# Patient Record
Sex: Female | Born: 2000 | Race: White | Hispanic: No | Marital: Single | Smoking: Never smoker
Health system: Southern US, Community
[De-identification: ages and names within clinical notes are randomized; demographics above are authoritative.]

---

## 2021-01-31 ENCOUNTER — Emergency Department
Admission: EM | Admit: 2021-01-31 | Discharge: 2021-01-31 | Disposition: A | Discharge status: Home / Self Care | Payer: PRIVATE HEALTH INSURANCE | Attending: Student in an Organized Health Care Education/Training Program | Admitting: Student in an Organized Health Care Education/Training Program

## 2021-01-31 ENCOUNTER — Emergency Department: Payer: PRIVATE HEALTH INSURANCE

## 2021-01-31 ENCOUNTER — Other Ambulatory Visit: Payer: Self-pay

## 2021-01-31 ENCOUNTER — Encounter: Payer: Self-pay | Admitting: Emergency Medicine

## 2021-01-31 DIAGNOSIS — S93491A Sprain of other ligament of right ankle, initial encounter: Secondary | ICD-10-CM

## 2021-01-31 DIAGNOSIS — S99911A Unspecified injury of right ankle, initial encounter: Secondary | ICD-10-CM | POA: Diagnosis present

## 2021-01-31 DIAGNOSIS — S93431A Sprain of tibiofibular ligament of right ankle, initial encounter: Secondary | ICD-10-CM | POA: Diagnosis not present

## 2021-01-31 DIAGNOSIS — W130XXA Fall from, out of or through balcony, initial encounter: Secondary | ICD-10-CM | POA: Diagnosis not present

## 2021-01-31 MED ORDER — HYDROCODONE-ACETAMINOPHEN 5-325 MG PO TABS: 1.0000 | ORAL_TABLET | ORAL | 0 refills | Status: DC | PRN

## 2021-01-31 NOTE — ED Provider Notes (Signed)
[facility] Emergency Department Provider Note    Event Date/Time   First MD Initiated Contact with Patient 01/31/21 1214     (approximate)  I have reviewed the triage vital signs and the nursing notes.   HISTORY  Chief Complaint Fall and Ankle Pain    HPI Sandra Delacruz is a 20 y.o. female presents to the ER for evaluation of right ankle injury that occurred after she was on a balcony last night and a friend that was pulling on her leg and she fell off the balcony rolling her right ankle.  Did not hit her head no other injury.  Is having some swelling to the right lateral aspect of her ankle.  Has had sprain and tendon injury to her left foot but never on the right.  Denies any numbness or tingling.  No other injury reported.  Took some Motrin without much relief.  Declining anything for pain right now  History reviewed. No pertinent past medical history. No family history on file. History reviewed. No pertinent surgical history. There are no problems to display for this patient.     Prior to Admission medications   Medication Sig Start Date End Date Taking? Authorizing Provider  HYDROcodone-acetaminophen (NORCO) 5-325 MG tablet Take 1 tablet by mouth every 4 (four) hours as needed for moderate pain. 01/31/21  Yes Willy Eddy, MD    Allergies Patient has no allergy information on record.    Social History    Review of Systems Patient denies headaches, rhinorrhea, blurry vision, numbness, shortness of breath, chest pain, edema, cough, abdominal pain, nausea, vomiting, diarrhea, dysuria, fevers, rashes or hallucinations unless otherwise stated above in HPI. ____________________________________________   PHYSICAL EXAM:  VITAL SIGNS: Vitals:   01/31/21 1129  BP: 112/62  Pulse: 67  Resp: 20  Temp: 98.2 F (36.8 C)  SpO2: 97%    Constitutional: Alert and oriented. Well appearing and in no acute distress. Eyes: Conjunctivae are  normal.  Head: Atraumatic. Nose: No congestion/rhinnorhea. Mouth/Throat: Mucous membranes are moist.   Neck: Painless ROM.  Cardiovascular:   Good peripheral circulation. Respiratory: Normal respiratory effort.  No retractions.  Gastrointestinal: Soft and nontender.  Musculoskeletal: Swelling and some ecchymosis of the right ATFL.  Neurovascular intact distally.  Neurologic:  Normal speech and language. No gross focal neurologic deficits are appreciated.  Skin:  Skin is warm, dry and intact. No rash noted. Psychiatric: Mood and affect are normal. Speech and behavior are normal.  ____________________________________________   LABS (all labs ordered are listed, but only abnormal results are displayed)  No results found for this or any previous visit (from the past 24 hour(s)). ____________________________________________ ____________________________________________  RADIOLOGY  I personally reviewed all radiographic images ordered to evaluate for the above acute complaints and reviewed radiology reports and findings.  These findings were personally discussed with the patient.  Please see medical record for radiology report.  ____________________________________________   PROCEDURES  Procedure(s) performed:  Procedures    Critical Care performed: no ____________________________________________   INITIAL IMPRESSION / ASSESSMENT AND PLAN / ED COURSE  Pertinent labs & imaging results that were available during my care of the patient were reviewed by me and considered in my medical decision making (see chart for details).   DDX: Fracture, contusion, dislocation, sprain  Sandra Delacruz is a 20 y.o. who presents to the ED with 20 y.o. female with right ankle injury. Patient is AFVSS in ED. Exam as above. Given current presentation have considered the above differential.  Denies any other injuries. Denies motor or sensory loss. Able to bear weight. Afebrile and VSS in Ed. Exam as  above. NV intact throughout and distal to injury. Pt able to range joint. No clinical suspicion for infectious process or septic joint. Treatments will include observation, X-rays.  X-rays w/o fracture. No other injuries reported or noted on exam. Presentation c/w sprain. Discussed supportive care and follow up with pt.     The patient was evaluated in Emergency Department today for the symptoms described in the history of present illness. He/she was evaluated in the context of the global COVID-19 pandemic, which necessitated consideration that the patient might be at risk for infection with the SARS-CoV-2 virus that causes COVID-19. Institutional protocols and algorithms that pertain to the evaluation of patients at risk for COVID-19 are in a state of rapid change based on information released by regulatory bodies including the CDC and federal and state organizations. These policies and algorithms were followed during the patient's care in the ED.   ____________________________________________   FINAL CLINICAL IMPRESSION(S) / ED DIAGNOSES  Final diagnoses:  Sprain of anterior talofibular ligament of right ankle, initial encounter      NEW MEDICATIONS STARTED DURING THIS VISIT:  New Prescriptions   HYDROCODONE-ACETAMINOPHEN (NORCO) 5-325 MG TABLET    Take 1 tablet by mouth every 4 (four) hours as needed for moderate pain.     Note:  This document was prepared using Dragon voice recognition software and may include unintentional dictation errors.     Willy Eddy, MD 01/31/21 [phone]

## 2021-01-31 NOTE — ED Triage Notes (Signed)
Pt reports fell off a porch yesterday hurting her right ankle. Pt states painful to walk on

## 2021-01-31 NOTE — ED Notes (Signed)
Follow uortho info provided crutches and splint provided all questions answered

## 2021-07-07 ENCOUNTER — Emergency Department
Admission: EM | Admit: 2021-07-07 | Discharge: 2021-07-07 | Disposition: A | Discharge status: Home / Self Care | Payer: No Typology Code available for payment source | Attending: Emergency Medicine | Admitting: Emergency Medicine

## 2021-07-07 ENCOUNTER — Encounter: Payer: Self-pay | Admitting: Emergency Medicine

## 2021-07-07 DIAGNOSIS — L292 Pruritus vulvae: Secondary | ICD-10-CM | POA: Diagnosis present

## 2021-07-07 DIAGNOSIS — N3000 Acute cystitis without hematuria: Secondary | ICD-10-CM | POA: Diagnosis not present

## 2021-07-07 LAB — WET PREP, GENITAL
Clue Cells Wet Prep HPF POC: NONE SEEN
Sperm: NONE SEEN
Trich, Wet Prep: NONE SEEN
WBC, Wet Prep HPF POC: >=10 — AB (ref <10)
Yeast Wet Prep HPF POC: NONE SEEN

## 2021-07-07 LAB — URINALYSIS, COMPLETE (UACMP) WITH MICROSCOPIC
Bilirubin Urine: NEGATIVE
Glucose, UA: NEGATIVE mg/dL
Hgb urine dipstick: NEGATIVE
Ketones, ur: NEGATIVE mg/dL
Nitrite: NEGATIVE
Protein, ur: 30 mg/dL — AB
Specific Gravity, Urine: 1.028 (ref 1.005–1.030)
WBC, UA: >50 WBC/hpf — ABNORMAL HIGH (ref 0–5)
pH: 6 (ref 5.0–8.0)

## 2021-07-07 LAB — POC URINE PREG, ED: Preg Test, Ur: NEGATIVE

## 2021-07-07 MED ORDER — CEPHALEXIN 500 MG PO CAPS: 500.0000 mg | ORAL_CAPSULE | Freq: Four times a day (QID) | ORAL | 0 refills | Status: AC

## 2021-07-07 MED ORDER — CEPHALEXIN 500 MG PO CAPS
500.0000 mg | ORAL_CAPSULE | Freq: Once | ORAL | Status: AC
  Administered 2021-07-07: 500 mg via ORAL
  Filled 2021-07-07: qty 1

## 2021-07-07 NOTE — ED Triage Notes (Signed)
Pt c/o vaginal itching, burning, white discharge and pain x2 days. Pt used OTC Monistat today in which she sts made symptoms worse.  ?

## 2021-07-07 NOTE — Discharge Instructions (Addendum)
Take Keflex four times daily for the next seven days.  

## 2021-07-07 NOTE — ED Provider Notes (Signed)
? ?[facility] ?Provider Note ? ?Patient Contact: 10:58 PM (approximate) ? ? ?History  ? ?Vaginal Itching ? ? ?HPI ? ?Sandra Delacruz is a 21 y.o. female presents to the urgent care with increased vaginal discharge and vaginal pruritus for the past 2 days.  Patient also states that she has burning constantly.  She denies vaginal rash.  She denies concerns for STDs. ? ?  ? ? ?Physical Exam  ? ?Triage Vital Signs: ?ED Triage Vitals [07/07/21 2221]  ?Enc Vitals Group  ?   BP 127/87  ?   Pulse Rate 87  ?   Resp 16  ?   Temp 98.5 ?F (36.9 ?C)  ?   Temp Source Oral  ?   SpO2 100 %  ?   Weight 103 lb (46.7 kg)  ?   Height 5\' 3"  (1.6 m)  ?   Head Circumference   ?   Peak Flow   ?   Pain Score   ?   Pain Loc   ?   Pain Edu?   ?   Excl. in GC?   ? ? ?Most recent vital signs: ?Vitals:  ? 07/07/21 2221  ?BP: 127/87  ?Pulse: 87  ?Resp: 16  ?Temp: 98.5 ?F (36.9 ?C)  ?SpO2: 100%  ? ? ? ?General: Alert and in no acute distress. ?Eyes:  PERRL. EOMI. ?Head: No acute traumatic findings ?ENT: ?     Ears:  ?     Nose: No congestion/rhinnorhea. ?     Mouth/Throat: Mucous membranes are moist. ?Neck: No stridor. No cervical spine tenderness to palpation. ?Cardiovascular:  Good peripheral perfusion ?Respiratory: Normal respiratory effort without tachypnea or retractions. Lungs CTAB. Good air entry to the bases with no decreased or absent breath sounds. ?Gastrointestinal: Bowel sounds ?4 quadrants. Soft and nontender to palpation. No guarding or rigidity. No palpable masses. No distention. No CVA tenderness. ?Musculoskeletal: Full range of motion to all extremities.  ?Neurologic:  No gross focal neurologic deficits are appreciated.  ?Skin:   No rash noted ?Other: ? ? ?ED Results / Procedures / Treatments  ? ?Labs ?(all labs ordered are listed, but only abnormal results are displayed) ?Labs Reviewed  ?WET PREP, GENITAL - Abnormal; Notable for the following components:  ?    Result Value  ? WBC, Wet Prep HPF POC >=10 (*)   ?  All other components within normal limits  ?URINALYSIS, COMPLETE (UACMP) WITH MICROSCOPIC - Abnormal; Notable for the following components:  ? Color, Urine YELLOW (*)   ? APPearance CLOUDY (*)   ? Protein, ur 30 (*)   ? Leukocytes,Ua LARGE (*)   ? WBC, UA >50 (*)   ? Bacteria, UA RARE (*)   ? All other components within normal limits  ?CHLAMYDIA/NGC RT PCR ([facility] ONLY)            ?POC URINE PREG, ED  ? ? ? ? ? ?PROCEDURES: ? ?Critical Care performed: No ? ?Procedures ? ? ?MEDICATIONS ORDERED IN ED: ?Medications  ?cephALEXin (KEFLEX) capsule 500 mg (500 mg Oral Given 07/07/21 2322)  ? ? ? ?IMPRESSION / MDM / ASSESSMENT AND PLAN / ED COURSE  ?I reviewed the triage vital signs and the nursing notes. ?             ?               ? ?Differential diagnosis includes, but is not limited to, vaginal itching, increased vaginal discharge... ? ? ? Wet  prep shows no signs of yeast or BV. Urinalysis concerning for UTI. Urine pregnancy test negative. Will treat with Keflex four times daily for the next seven days.  ? ? ?FINAL CLINICAL IMPRESSION(S) / ED DIAGNOSES  ? ?Final diagnoses:  ?Acute cystitis without hematuria  ? ? ? ?Rx / DC Orders  ? ?ED Discharge Orders   ? ?      Ordered  ?  cephALEXin (KEFLEX) 500 MG capsule  4 times daily       ? 07/07/21 2317  ? ?  ?  ? ?  ? ? ? ?Note:  This document was prepared using Dragon voice recognition software and may include unintentional dictation errors. ?  ?Orvil Feil, PA-C ?07/07/21 2330 ? ?  ?Merwyn Katos, MD ?07/14/21 [phone] ? ?

## 2021-07-08 LAB — CHLAMYDIA/NGC RT PCR (ARMC ONLY)
Chlamydia Tr: NOT DETECTED
N gonorrhoeae: NOT DETECTED

## 2022-01-20 ENCOUNTER — Ambulatory Visit (INDEPENDENT_AMBULATORY_CARE_PROVIDER_SITE_OTHER): Payer: PRIVATE HEALTH INSURANCE | Admitting: Adult Health

## 2022-01-20 ENCOUNTER — Encounter: Payer: Self-pay | Admitting: Adult Health

## 2022-01-20 VITALS — BP 112/70 | HR 78 | Temp 97.7°F | Ht 63.0 in | Wt 117.0 lb

## 2022-01-20 DIAGNOSIS — J0140 Acute pansinusitis, unspecified: Secondary | ICD-10-CM

## 2022-01-20 MED ORDER — AZITHROMYCIN 250 MG PO TABS: ORAL_TABLET | ORAL | 0 refills | Status: AC

## 2022-01-20 NOTE — Progress Notes (Signed)
[facility] [address] [place], [place] [address] Phone: [phone] Fax: [phone]   Office Visit Note  Patient Name: Sandra Delacruz  Date of Birth:12-Mar-2001  Med Rec number [id number]  Date of Service: 01/20/2022  Patient has no known allergies.  Chief Complaint  Patient presents with   Sinusitis     Sinusitis Associated symptoms include chills, coughing, ear pain, sinus pressure and a sore throat. Pertinent negatives include no diaphoresis.    Patient reports congestion/ fatigue, feeling feverish/chills, sore throat, ear pressure,cough with history of sinus infections.  This started about 5 days ago.  She has taken advil and dayquil with mild improvement.  Her roommates have been sick, and she is a nursing major going to the Hospital.   Current Medication:  Outpatient Encounter Medications as of 01/20/2022  Medication Sig   azithromycin (ZITHROMAX) 250 MG tablet Take 2 tablets on day 1, then 1 tablet daily on days 2 through 5   norethindrone-ethinyl estradiol (NORTREL 0.5/35, 28,) 0.5-35 MG-MCG tablet Take 1 tablet by mouth daily.   HYDROcodone-acetaminophen (NORCO) 5-325 MG tablet Take 1 tablet by mouth every 4 (four) hours as needed for moderate pain. (Patient not taking: Reported on 01/20/2022)   No facility-administered encounter medications on file as of 01/20/2022.      Medical History: History reviewed. No pertinent past medical history.   Vital Signs: BP 112/70   Pulse 78   Temp 97.7 F (36.5 C) (Tympanic)   Ht 5\' 3"  (1.6 m)   Wt 117 lb (53.1 kg)   SpO2 100%   BMI 20.73 kg/m    Review of Systems  Constitutional:  Positive for chills, fatigue and fever. Negative for diaphoresis.  HENT:  Positive for ear pain, postnasal drip, sinus pressure and sore throat.   Eyes:  Negative for pain and itching.  Respiratory:  Positive for cough.   Cardiovascular:  Negative for chest pain.  Gastrointestinal:  Negative for diarrhea, nausea and  vomiting.    Physical Exam Vitals and nursing note reviewed.  Constitutional:      Appearance: Normal appearance.  HENT:     Head: Normocephalic.     Right Ear: Tympanic membrane and ear canal normal.     Left Ear: Tympanic membrane and ear canal normal.     Nose:     Right Turbinates: Not swollen or pale.     Left Turbinates: Not swollen or pale.     Right Sinus: Maxillary sinus tenderness and frontal sinus tenderness present.     Left Sinus: Maxillary sinus tenderness and frontal sinus tenderness present.     Mouth/Throat:     Mouth: Mucous membranes are moist.  Eyes:     Pupils: Pupils are equal, round, and reactive to light.  Cardiovascular:     Rate and Rhythm: Normal rate.  Pulmonary:     Effort: Pulmonary effort is normal.  Musculoskeletal:     Cervical back: Normal range of motion.  Neurological:     Mental Status: She is alert.    Assessment/Plan: 1. Acute non-recurrent pansinusitis Patient Instructions: -Take complete course of antibiotics as prescribed.  Take with food.  -Try Flonase/Fluticasone nasal spray, 2 sprays to each nostril once a day. -You can try using a neti pot or nasal saline rinse product to help clear mucus congestion. -Rest and stay well hydrated (by drinking water and other liquids). Avoid/limit caffeine. -Take over-the-counter medicines (i.e. Mucinex, decongestant, Ibuprofen or Tylenol, cough suppressant) to help relieve your symptoms. -For your  cough, use cough drops/throat lozenges, gargle warm salt water and/or drink warm liquids (like tea with honey). -Send my chart message to provider or schedule return visit as needed for new/worsening symptoms or if symptoms do not improve as discussed with antibiotic and other recommended treatment.   - azithromycin (ZITHROMAX) 250 MG tablet; Take 2 tablets on day 1, then 1 tablet daily on days 2 through 5  Dispense: 6 tablet; Refill: 0        General Counseling: Buffi verbalizes understanding of the  findings of todays visit and agrees with plan of treatment. I have discussed any further diagnostic evaluation that may be needed or ordered today. We also reviewed her medications today. she has been encouraged to call the office with any questions or concerns that should arise related to todays visit.   No orders of the defined types were placed in this encounter.   Meds ordered this encounter  Medications   azithromycin (ZITHROMAX) 250 MG tablet    Sig: Take 2 tablets on day 1, then 1 tablet daily on days 2 through 5    Dispense:  6 tablet    Refill:  0    Time spent:20 Minutes Time spent includes review of chart, medications, test results, and follow up plan with the patient.    Kendell Bane AGNP-C Nurse Practitioner

## 2022-04-22 ENCOUNTER — Encounter: Payer: Self-pay | Admitting: Oncology

## 2022-04-22 ENCOUNTER — Ambulatory Visit (INDEPENDENT_AMBULATORY_CARE_PROVIDER_SITE_OTHER): Payer: Commercial Managed Care - HMO | Admitting: Oncology

## 2022-04-22 ENCOUNTER — Other Ambulatory Visit: Payer: Self-pay

## 2022-04-22 VITALS — BP 120/58 | HR 88 | Temp 97.9°F | Resp 18 | Ht 64.0 in | Wt 116.0 lb

## 2022-04-22 DIAGNOSIS — J0140 Acute pansinusitis, unspecified: Secondary | ICD-10-CM | POA: Diagnosis not present

## 2022-04-22 LAB — POC SOFIA 2 FLU + SARS ANTIGEN FIA
Influenza A, POC: NEGATIVE
Influenza B, POC: NEGATIVE
SARS Coronavirus 2 Ag: NEGATIVE

## 2022-04-22 MED ORDER — AMOXICILLIN-POT CLAVULANATE 875-125 MG PO TABS: 1.0000 | ORAL_TABLET | Freq: Two times a day (BID) | ORAL | 0 refills | Status: DC

## 2022-04-22 NOTE — Progress Notes (Signed)
[address] [place], [place] [address] Phone: [phone] Fax: [phone]   Office Visit Note  Patient Name: Sandra Delacruz  Date of [id number]  Med Rec number [id number]  Date of Service: 04/22/2022  Patient has no known allergies.  No chief complaint on file.  Patient is an 22 y.o. student here for complaints of fatigue, sinus pain, ear pain, congestion, ST and dry cough that started over a week ago.  Thought her symptoms were improving at the beginning of the week but feels like they have worsened over the past 2 days.  Takes a daily gummy and occasional Advil.  Denies anything else over-the-counter.  No fevers. No N/V/D.  Eating and drinking well.  Has history of frequent sinus infections last treated about a year ago.    No allergies to antibiotics. Several sick contacts.   Had mono in highschool.   Current Medication:  Outpatient Encounter Medications as of 04/22/2022  Medication Sig   norethindrone-ethinyl estradiol (NORTREL 0.5/35, 28,) 0.5-35 MG-MCG tablet Take 1 tablet by mouth daily.   [DISCONTINUED] HYDROcodone-acetaminophen (NORCO) 5-325 MG tablet Take 1 tablet by mouth every 4 (four) hours as needed for moderate pain. (Patient not taking: Reported on 01/20/2022)   No facility-administered encounter medications on file as of 04/22/2022.      Medical History: History reviewed. No pertinent past medical history.   Vital Signs: BP (!) 120/58   Pulse 88   Temp 97.9 F (36.6 C) (Tympanic)   Resp 18   Ht 5\' 4"  (1.626 m)   Wt 116 lb (52.6 kg)   SpO2 99%   BMI 19.91 kg/m   ROS: As per HPI.  All other pertinent ROS negative.     Review of Systems  Constitutional:  Positive for chills, diaphoresis and fatigue.  HENT:  Positive for congestion, ear pain, postnasal drip, sinus pressure, sinus pain and sore throat.   Respiratory:  Positive for cough.   Gastrointestinal:  Negative for diarrhea, nausea and vomiting.  Musculoskeletal:   Positive for myalgias.  Neurological:  Positive for headaches. Negative for dizziness.  Hematological:  Negative for adenopathy.    Physical Exam Vitals reviewed.  Constitutional:      Appearance: Normal appearance.  HENT:     Right Ear: Tympanic membrane normal.     Left Ear: Tympanic membrane normal.     Nose: Nasal tenderness, congestion and rhinorrhea present.     Right Turbinates: Swollen.     Left Turbinates: Swollen.     Right Sinus: No maxillary sinus tenderness or frontal sinus tenderness.     Left Sinus: No maxillary sinus tenderness or frontal sinus tenderness.     Mouth/Throat:     Mouth: Mucous membranes are moist.     Pharynx: Posterior oropharyngeal erythema present.  Cardiovascular:     Rate and Rhythm: Normal rate and regular rhythm.  Pulmonary:     Effort: Pulmonary effort is normal.     Breath sounds: Normal breath sounds.  Lymphadenopathy:     Cervical: No cervical adenopathy.  Neurological:     Mental Status: She is alert.     No results found for this or any previous visit (from the past 24 hour(s)).  Assessment/Plan: 1. Acute non-recurrent pansinusitis -Exam consistent with pansinusitis.  Recommend continuing over-the-counter immune support Gummies but adding Flonase 2 sprays each nostril daily and a decongestant of some sort such as Mucinex or Sudafed.  Discussed Advil for headaches or body aches 2-3 tabs as needed.  Take Augmentin twice daily x 7 days.  Has had in the past and tolerates well.  Take with food.  Provided written and verbal instructions on when and how to take along with side effects in clinic and via [facility].  Please let me know if you need anything additional.  - amoxicillin-clavulanate (AUGMENTIN) 875-125 MG tablet; Take 1 tablet by mouth 2 (two) times daily.  Dispense: 14 tablet; Refill: 0 - POC SOFIA 2 FLU + SARS ANTIGEN FIA   Disposition-return to clinic as needed.  General Counseling: Notnamed verbalizes understanding of the  findings of todays visit and agrees with plan of treatment. I have discussed any further diagnostic evaluation that may be needed or ordered today. We also reviewed her medications today. she has been encouraged to call the office with any questions or concerns that should arise related to todays visit.   No orders of the defined types were placed in this encounter.   No orders of the defined types were placed in this encounter.   I spent 20 minutes dedicated to the care of this patient (face-to-face and non-face-to-face) on the date of the encounter to include what is described in the assessment and plan.   Faythe Casa, NP 04/22/2022 10:19 AM

## 2022-04-27 ENCOUNTER — Encounter: Payer: Self-pay | Admitting: Medical

## 2022-04-27 ENCOUNTER — Ambulatory Visit (INDEPENDENT_AMBULATORY_CARE_PROVIDER_SITE_OTHER): Payer: Commercial Managed Care - HMO | Admitting: Medical

## 2022-04-27 ENCOUNTER — Ambulatory Visit: Payer: Commercial Managed Care - HMO | Admitting: Oncology

## 2022-04-27 ENCOUNTER — Other Ambulatory Visit: Payer: Self-pay

## 2022-04-27 VITALS — BP 99/61 | HR 90 | Temp 100.3°F | Ht 64.02 in | Wt 116.0 lb

## 2022-04-27 DIAGNOSIS — R051 Acute cough: Secondary | ICD-10-CM | POA: Diagnosis not present

## 2022-04-27 DIAGNOSIS — R0981 Nasal congestion: Secondary | ICD-10-CM

## 2022-04-27 MED ORDER — ALBUTEROL SULFATE HFA 108 (90 BASE) MCG/ACT IN AERS
1.0000 | INHALATION_SPRAY | RESPIRATORY_TRACT | 0 refills | Status: DC | PRN
Start: 2022-04-27 — End: 2022-05-16

## 2022-04-27 NOTE — Progress Notes (Signed)
[address] [place], [place] [address] Phone: [phone] Fax: [phone]   Office Visit Note  Patient Name: Sandra Delacruz  Date of [id number]  Med Rec number [id number]  Date of Service: 04/27/2022  Allergies: Patient has no known allergies.  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 Seen 5 days ago, dx with acute sinusitis. Given 7-day course of Augmentin, has 2-3 days remaining.  Some symptoms improved (less ear congestion). No longer has runny nose but still congested. A lot of clear nasal d/c. Has developed a dry cough. Feels harder to take deep breath, some anterior chest discomfort. Had had some sweats during day in last 3 days, no chills. No sore throat. Still has sinus pressure off and on.  Used Afrin for few days. No other otc meds tried. Used some Advil initially, but none recently. Not sleeping well, hard to fall asleep due to cough. Patient wondering if she needs Zpack, states this is what she has been given in past for sinus infection.  Denies recent flu/COVID exposure. Tested negative for these at visit 5d ago.     Current Medication:  Outpatient Encounter Medications as of 04/27/2022  Medication Sig   amoxicillin-clavulanate (AUGMENTIN) 875-125 MG tablet Take 1 tablet by mouth 2 (two) times daily.   norethindrone-ethinyl estradiol (NORTREL 0.5/35, 28,) 0.5-35 MG-MCG tablet Take 1 tablet by mouth daily.   No facility-administered encounter medications on file as of 04/27/2022.      Medical History: History reviewed. No pertinent past medical history.   Vital Signs: BP 99/61   Pulse 90   Temp 100.3 F (37.9 C) (Tympanic)   Ht 5' 4.02" (1.626 m)   Wt 116 lb (52.6 kg)   SpO2 98%   BMI 19.90 kg/m    Review of Systems See HPI  Physical Exam Vitals reviewed.  Constitutional:      General: She is not in acute distress.    Appearance:  She is not ill-appearing.     Comments: Tired appearing  HENT:     Head: Normocephalic.     Right Ear: Ear canal and external ear normal.     Left Ear: Ear canal and external ear normal.     Ears:     Comments: TMs slightly dull    Nose: Mucosal edema, congestion and rhinorrhea (cloudy) present.     Comments: Slight tender over maxillary sinuses bilaterally    Mouth/Throat:     Mouth: Mucous membranes are moist. No oral lesions.     Pharynx: Posterior oropharyngeal erythema (mild) present. No pharyngeal swelling.     Tonsils: No tonsillar exudate. 0 on the right. 0 on the left.  Cardiovascular:     Rate and Rhythm: Normal rate and regular rhythm.     Heart sounds: No murmur heard.    No friction rub. No gallop.  Pulmonary:     Effort: Pulmonary effort is normal.     Breath sounds: Normal breath sounds. No wheezing, rhonchi or rales.     Comments: Dry sounding cough Musculoskeletal:     Cervical back: Neck supple. No rigidity.  Lymphadenopathy:     Cervical: Cervical adenopathy (1-2+ anterior nodes, mildly tender) present.  Neurological:     Mental Status: She is alert.     Assessment/Plan: 1. Acute cough 2. Nasal congestion Feel that lingering symptoms may be due to viral co-infection, am not  convinced of need for second antibiotic. Patient declined to repeat POC influenza/COVID-19 antigen tests today. Will check CBC with differential to better characterize illness. If CBC suggestive of bacterial infection, would consider switching to/adding Zpack. If CBC normal, would consider steroid.  Discussed Flonase, Advil Cold and Sinus and OTC cough suppressant for symptomatic relief. Gave albuterol inhaler to use as needed for chest discomfort, shortness of breath or cough.  - albuterol (VENTOLIN HFA) 108 (90 Base) MCG/ACT inhaler; Inhale 1-2 puffs into the lungs every 4 (four) hours as needed for wheezing or shortness of breath (cough).  Dispense: 8 g; Refill: 0 - CBC with  Differential/Platelet    General Counseling: Atianna verbalizes understanding of the findings of todays visit and agrees with plan of treatment. she has been encouraged to call the office with any questions or concerns that should arise related to todays visit.    Time spent: Larson PA-C [place] 04/27/2022 3:23 PM

## 2022-04-28 DIAGNOSIS — J3489 Other specified disorders of nose and nasal sinuses: Secondary | ICD-10-CM

## 2022-04-28 DIAGNOSIS — R051 Acute cough: Secondary | ICD-10-CM

## 2022-04-28 LAB — CBC WITH DIFFERENTIAL/PLATELET
Basophils Absolute: 0 10*3/uL (ref 0.0–0.2)
Basos: 1 %
EOS (ABSOLUTE): 0.1 10*3/uL (ref 0.0–0.4)
Eos: 2 %
Hematocrit: 37.6 % (ref 34.0–46.6)
Hemoglobin: 12.8 g/dL (ref 11.1–15.9)
Immature Grans (Abs): 0 10*3/uL (ref 0.0–0.1)
Immature Granulocytes: 1 %
Lymphocytes Absolute: 0.7 10*3/uL (ref 0.7–3.1)
Lymphs: 17 %
MCH: 31.7 pg (ref 26.6–33.0)
MCHC: 34 g/dL (ref 31.5–35.7)
MCV: 93 fL (ref 79–97)
Monocytes Absolute: 0.4 10*3/uL (ref 0.1–0.9)
Monocytes: 10 %
Neutrophils Absolute: 2.9 10*3/uL (ref 1.4–7.0)
Neutrophils: 69 %
Platelets: 239 10*3/uL (ref 150–450)
RBC: 4.04 x10E6/uL (ref 3.77–5.28)
RDW: 11.9 % (ref 11.7–15.4)
WBC: 4.1 10*3/uL (ref 3.4–10.8)

## 2022-04-28 MED ORDER — PREDNISONE 20 MG PO TABS: 40.0000 mg | ORAL_TABLET | Freq: Every day | ORAL | 0 refills | Status: AC

## 2022-04-29 ENCOUNTER — Ambulatory Visit: Payer: Commercial Managed Care - HMO | Admitting: Oncology

## 2022-04-30 ENCOUNTER — Encounter: Payer: Self-pay | Admitting: Medical

## 2022-04-30 NOTE — Patient Instructions (Addendum)
-  Rest and stay well hydrated (by drinking water and other liquids).  -Continue antibiotic. -Take over-the-counter medicines (i.e. Advil Cold & Sinus, cough suppressant to help relieve your symptoms. -Take Flonase/Fluticasone nasal spray, 2 sprays to each nostril once a day. -Use albuterol inhaler every 4-6 hours as needed for chest discomfort, shortness of breath or cough. -For your cough, use cough drops/throat lozenges, gargle warm salt water and/or drink warm liquids (like tea with honey).  -You will receive a MyChart message notifying you of your lab results when they are available. -Send MyChart message to provider in meantime with questions or concerns.

## 2022-05-15 ENCOUNTER — Other Ambulatory Visit: Payer: Self-pay | Admitting: Medical

## 2022-05-15 DIAGNOSIS — R051 Acute cough: Secondary | ICD-10-CM

## 2022-06-18 ENCOUNTER — Other Ambulatory Visit: Payer: Self-pay | Admitting: Medical

## 2022-06-18 DIAGNOSIS — R051 Acute cough: Secondary | ICD-10-CM

## 2022-06-18 NOTE — Telephone Encounter (Signed)
Patient should contact provider or schedule follow up visit if they need additional refill.

## 2023-03-13 ENCOUNTER — Other Ambulatory Visit: Payer: Self-pay

## 2023-03-13 ENCOUNTER — Ambulatory Visit (INDEPENDENT_AMBULATORY_CARE_PROVIDER_SITE_OTHER): Payer: Self-pay | Admitting: Adult Health

## 2023-03-13 ENCOUNTER — Encounter: Payer: Self-pay | Admitting: Adult Health

## 2023-03-13 VITALS — Temp 97.5°F

## 2023-03-13 DIAGNOSIS — Z4802 Encounter for removal of sutures: Secondary | ICD-10-CM

## 2023-03-13 NOTE — Progress Notes (Signed)
[facility] [address] [place], [place] [address] Phone: [phone] Fax: [phone]   Office Visit Note  Patient Name: Sandra Delacruz  Date of Birth:[birth year]  Med Rec number [id number]  Date of Service: 03/13/2023  Patient has no known allergies.  Chief Complaint  Patient presents with   Suture / Staple Removal    6 stitches placed on left shoulder blade area     Suture / Staple Removal   Pt here requesting suture removal.  She had a mole removed by dermatology 2 weeks ago.  She has 6 sutures in her left scapular area.    Current Medication:  Outpatient Encounter Medications as of 03/13/2023  Medication Sig   albuterol (VENTOLIN HFA) 108 (90 Base) MCG/ACT inhaler INHALE 1-2 PUFFS INTO THE LUNGS EVERY 4 (FOUR) HOURS AS NEEDED FOR WHEEZING OR SHORTNESS OF BREATH (COUGH).   norethindrone-ethinyl estradiol (NORTREL 0.5/35, 28,) 0.5-35 MG-MCG tablet Take 1 tablet by mouth daily.   [DISCONTINUED] amoxicillin-clavulanate (AUGMENTIN) 875-125 MG tablet Take 1 tablet by mouth 2 (two) times daily. (Patient not taking: Reported on 03/13/2023)   No facility-administered encounter medications on file as of 03/13/2023.      Medical History: History reviewed. No pertinent past medical history.   Vital Signs: Temp (!) 97.5 F (36.4 C) (Tympanic)    Review of Systems  Constitutional:  Negative for chills, fatigue and fever.  Skin:  Positive for wound.    Physical Exam Vitals reviewed.  Skin:    Comments: Surgical incision.repair of left scapular area.  6 interrupted sutures present.     Assessment/Plan: 1. Visit for suture removal 6 sutures removed.  Site cleaned, triple antibiotics ointment applied.  Covered with bandage. Follow up via MyChart messenger if symptoms fail to improve or may return to clinic as needed for worsening symptoms.       General Counseling: Sandra Delacruz verbalizes understanding of the findings of todays visit and agrees with plan of treatment.  I have discussed any further diagnostic evaluation that may be needed or ordered today. We also reviewed her medications today. she has been encouraged to call the office with any questions or concerns that should arise related to todays visit.   No orders of the defined types were placed in this encounter.   No orders of the defined types were placed in this encounter.   Time spent:15 Minutes Time spent includes review of chart, medications, test results, and follow up plan with the patient.    Johnna Acosta AGNP-C Nurse Practitioner

## 2023-05-03 IMAGING — DX DG ANKLE COMPLETE 3+V*R*
3 series · 3 of 3 positions shown · non-contrast
Comparison: None.

CLINICAL DATA: Trauma, fall

EXAM:
RIGHT ANKLE - COMPLETE 3+ VIEW

[ankle ap]
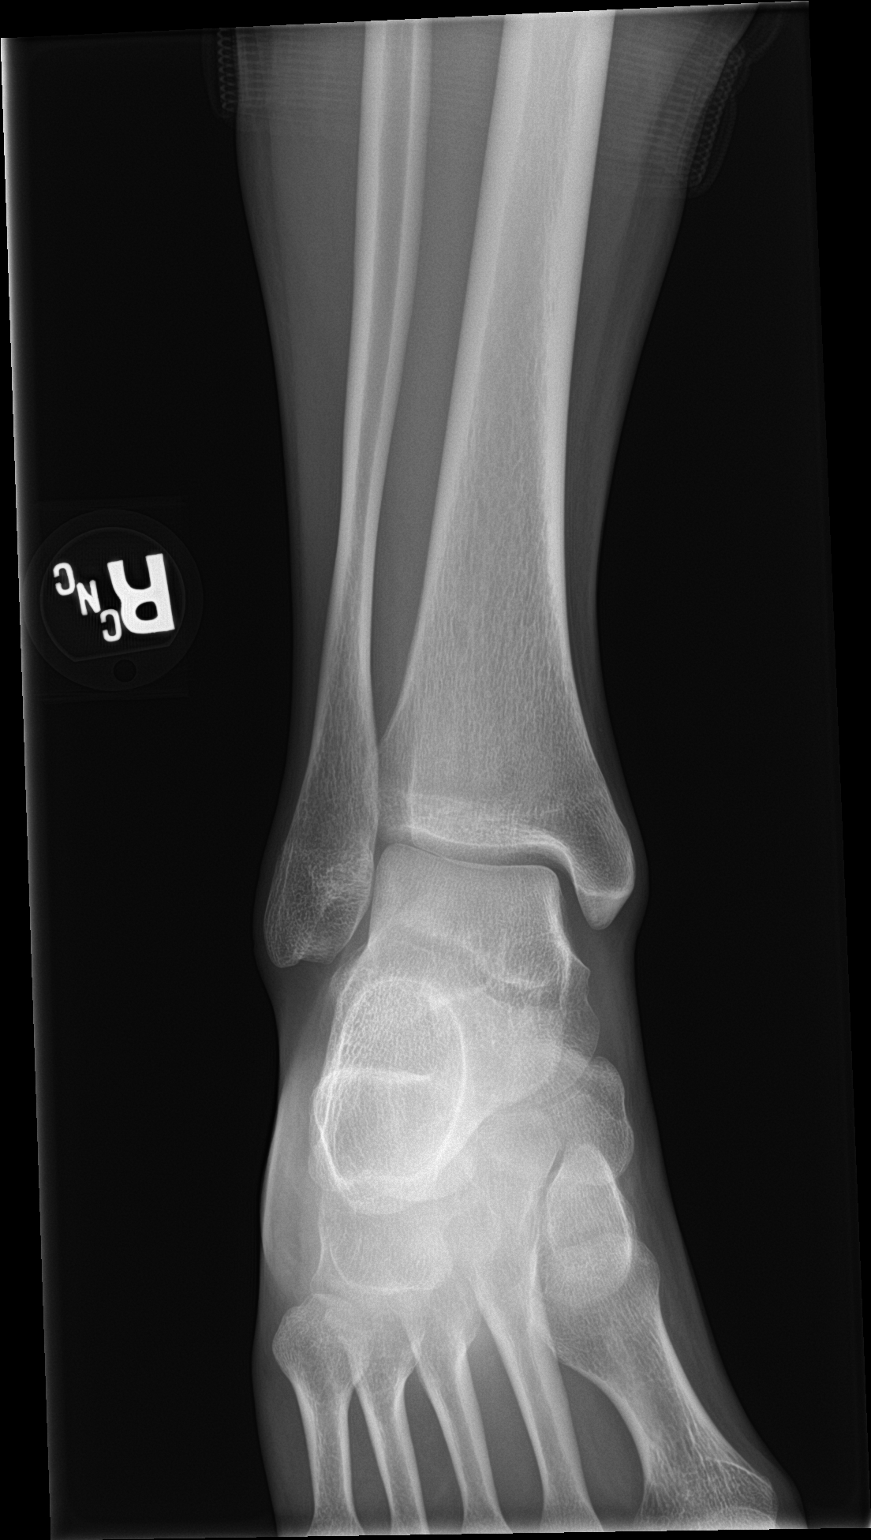

[ankle obl]
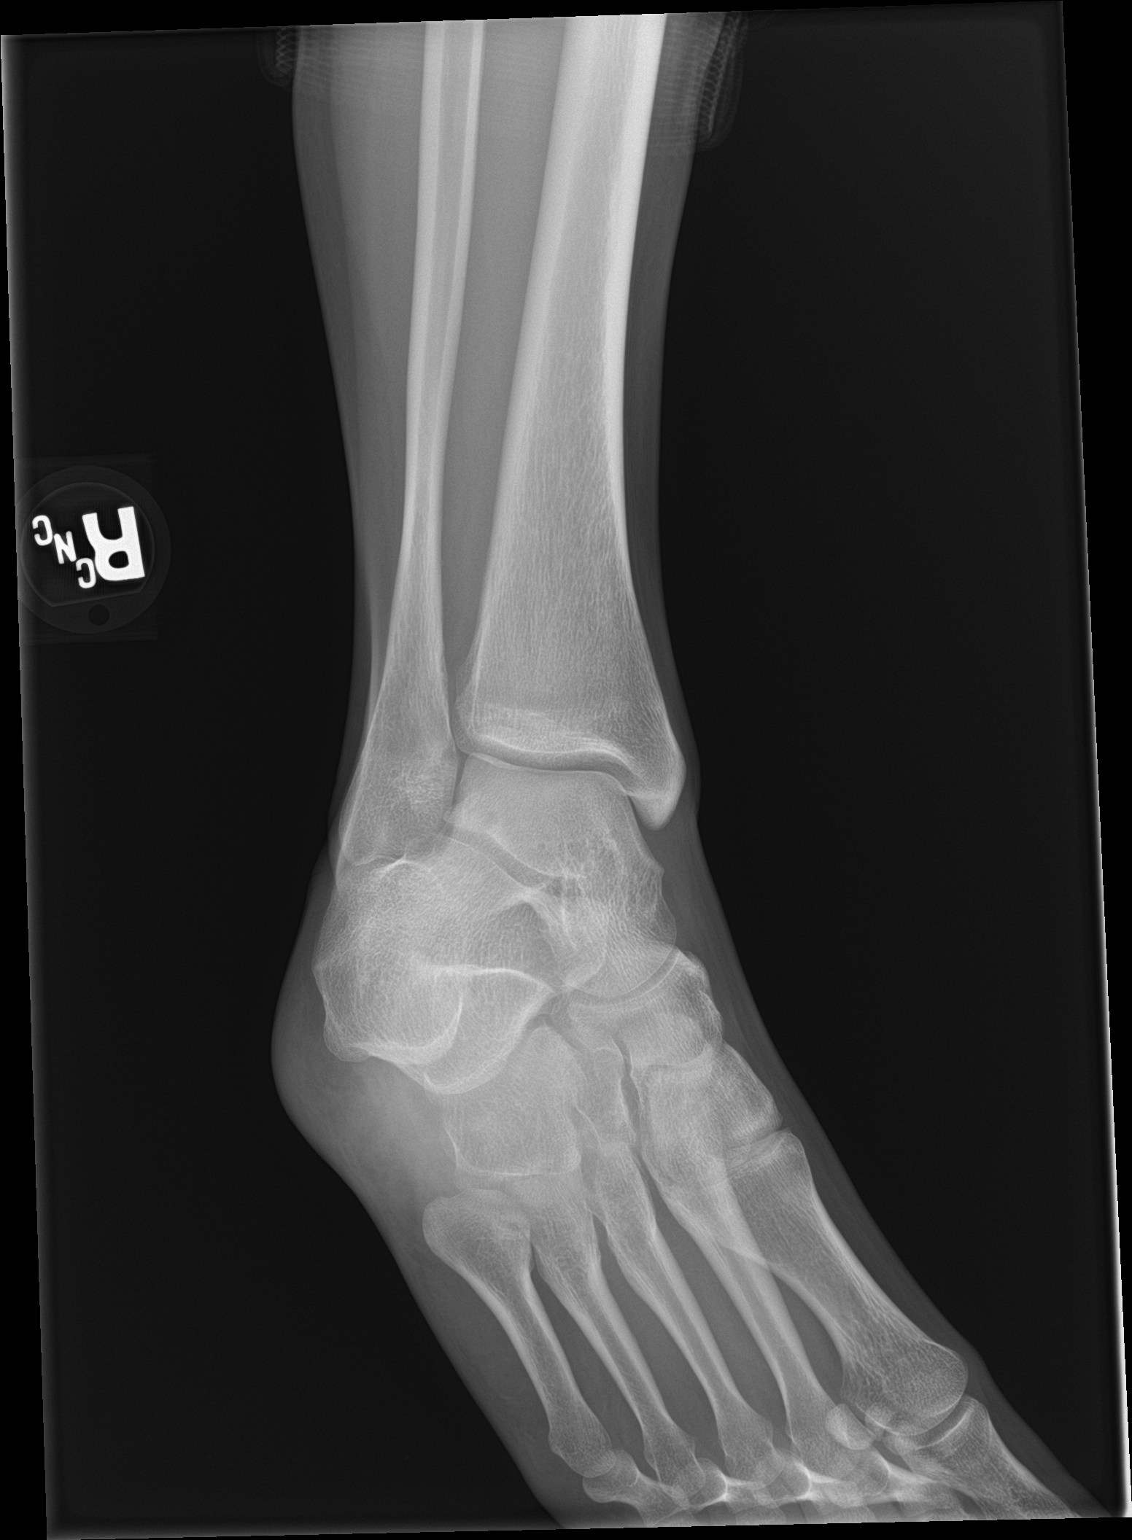

[ankle lat]
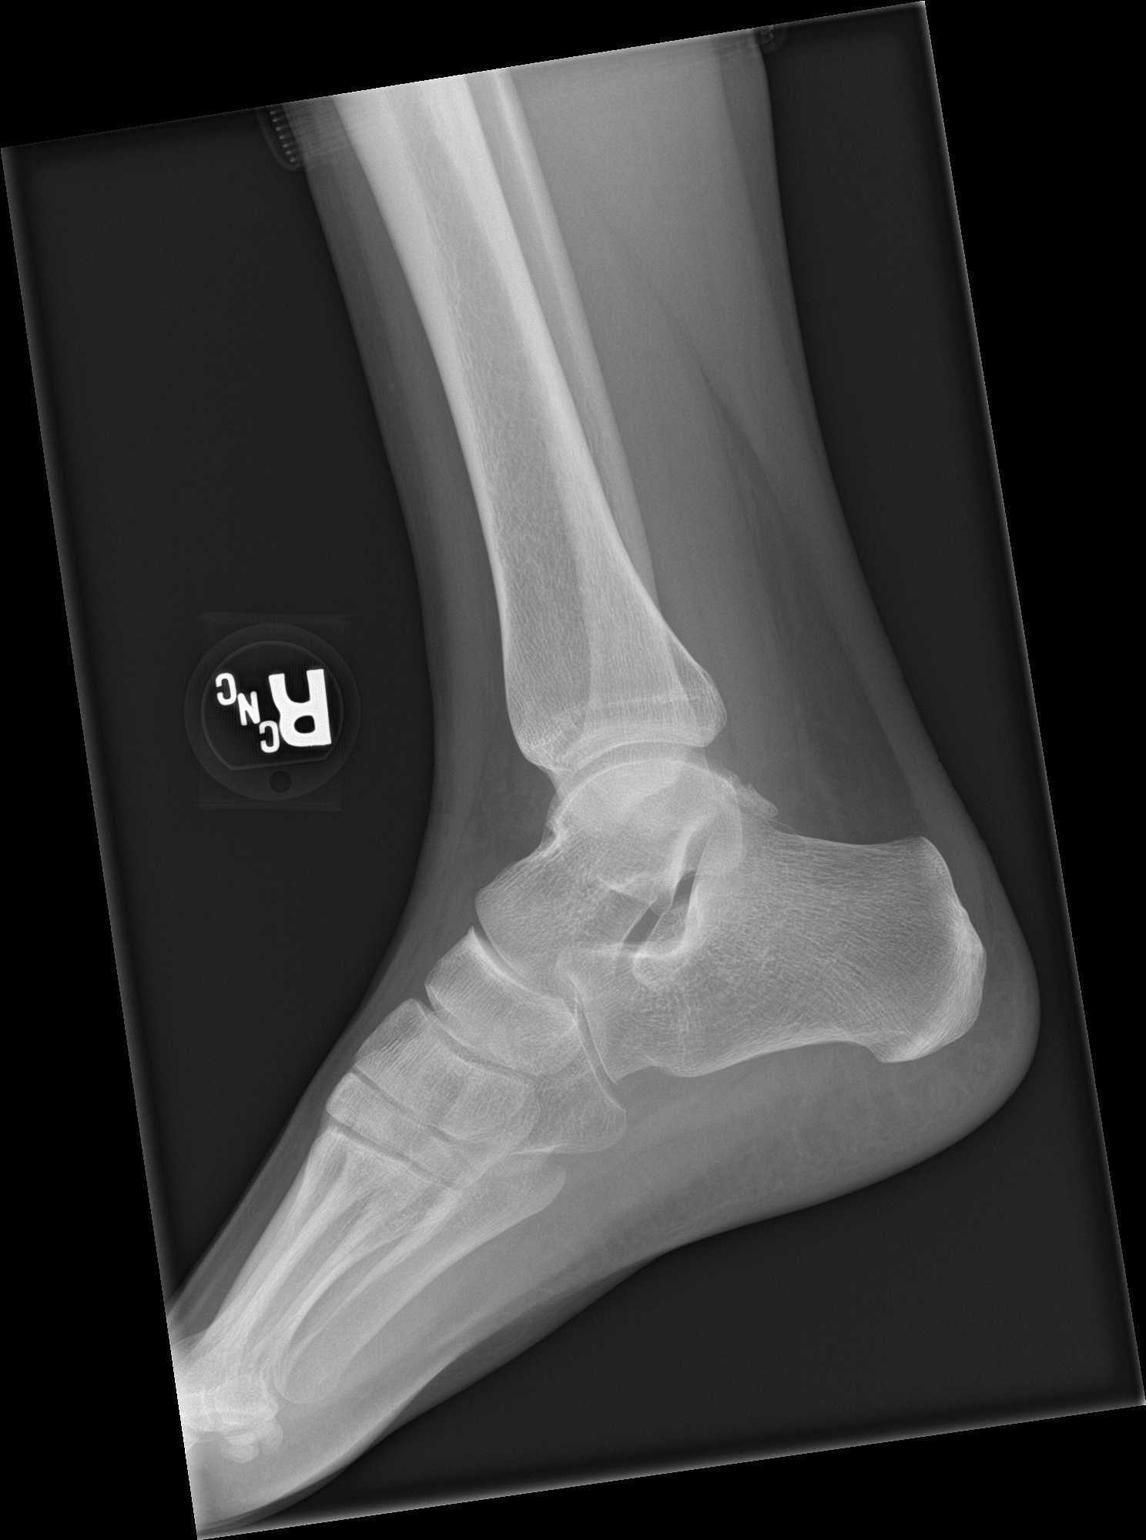

[3 of 3 positions shown; findings below may reference images not displayed]

FINDINGS: There is no evidence of fracture, dislocation, or joint effusion.
There is no evidence of arthropathy or other focal bone abnormality.
Soft tissues are unremarkable.
IMPRESSION: Negative.

## 2023-06-12 ENCOUNTER — Other Ambulatory Visit: Payer: Self-pay

## 2023-06-12 ENCOUNTER — Encounter: Payer: Self-pay | Admitting: Adult Health

## 2023-06-12 ENCOUNTER — Ambulatory Visit (INDEPENDENT_AMBULATORY_CARE_PROVIDER_SITE_OTHER): Payer: Self-pay | Admitting: Adult Health

## 2023-06-12 VITALS — BP 98/81 | HR 94 | Temp 96.6°F

## 2023-06-12 DIAGNOSIS — J039 Acute tonsillitis, unspecified: Secondary | ICD-10-CM

## 2023-06-12 MED ORDER — CEPHALEXIN 500 MG PO CAPS: 500.0000 mg | ORAL_CAPSULE | Freq: Two times a day (BID) | ORAL | 0 refills | Status: AC

## 2023-06-12 NOTE — Progress Notes (Signed)
 [facility] [address] [place], [place] [address] Phone: [phone] Fax: [phone]   Office Visit Note  Patient Name: Sandra Delacruz  Date of Birth:08/04/2000  Med Rec number [id number]  Date of Service: 06/12/2023  Patient has no known allergies.  Chief Complaint  Patient presents with   Acute Visit     HPI  Patient reports 3 days ago body aches, fatigue, sweats, chills, ear pain, dizzy, congestion, and sore throat.  Denies cough.  Roommate has similar symptoms.  She took tylenol PM last night, which helped her sleep.  Current Medication:  Outpatient Encounter Medications as of 06/12/2023  Medication Sig   cephALEXin (KEFLEX) 500 MG capsule Take 1 capsule (500 mg total) by mouth 2 (two) times daily.   norethindrone-ethinyl estradiol (NORTREL 0.5/35, 28,) 0.5-35 MG-MCG tablet Take 1 tablet by mouth daily.   [DISCONTINUED] albuterol (VENTOLIN HFA) 108 (90 Base) MCG/ACT inhaler INHALE 1-2 PUFFS INTO THE LUNGS EVERY 4 (FOUR) HOURS AS NEEDED FOR WHEEZING OR SHORTNESS OF BREATH (COUGH). (Patient not taking: Reported on 06/12/2023)   No facility-administered encounter medications on file as of 06/12/2023.      Medical History: History reviewed. No pertinent past medical history.   Vital Signs: BP 98/81   Pulse 94   Temp (!) 96.6 F (35.9 C) (Tympanic)   SpO2 98%    Review of Systems  Constitutional:  Positive for chills, diaphoresis and fatigue.  HENT:  Positive for congestion, ear pain and sore throat.   Eyes:  Negative for pain and itching.  Respiratory:  Negative for cough.   Cardiovascular:  Negative for chest pain.  Gastrointestinal:  Negative for diarrhea, nausea and vomiting.  Neurological:  Positive for dizziness.    Physical Exam Constitutional:      Appearance: Normal appearance.  HENT:     Head: Normocephalic.     Right Ear: Tympanic membrane and ear canal normal.     Left Ear: Tympanic membrane and ear canal normal.     Nose: Nose  normal.     Mouth/Throat:     Mouth: Mucous membranes are moist.     Pharynx: Posterior oropharyngeal erythema present.  Eyes:     Pupils: Pupils are equal, round, and reactive to light.  Cardiovascular:     Rate and Rhythm: Normal rate.  Pulmonary:     Effort: Pulmonary effort is normal.     Breath sounds: Normal breath sounds.  Lymphadenopathy:     Cervical: Cervical adenopathy present.  Neurological:     Mental Status: She is alert.    Assessment/Plan: 1. Tonsillitis (Primary) Finish all antibiotics as prescribed, even when you are feeling better.  TAke antibiotic with food.  Take an over-the-counter pain reliever/anti-inflammatory (such as ibuprofen) to help relieve pain or fever.  Rest and drink plenty of water.  Drinking warm or cold liquids (such as tea, soup or smoothies) and eating soft foods (such as oatmeal) may be more comfortable until your throat pain improves.  Do not share cups/water bottles/ utensils with others.  Wash your hands or use hand sanitizer often, especially before/after eating and after coughing into your hand or blowing your nose.  Send secure message to provider or schedule return appointment as needed for new/worsening symptoms (such as increased throat pain) or if your symptoms are not improving after 2-3 days on the antibiotic.    - cephALEXin (KEFLEX) 500 MG capsule; Take 1 capsule (500 mg total) by mouth 2 (two) times daily.  Dispense: 20 capsule;  Refill: 0     General Counseling: [name] verbalizes understanding of the findings of todays visit and agrees with plan of treatment. I have discussed any further diagnostic evaluation that may be needed or ordered today. We also reviewed her medications today. she has been encouraged to call the office with any questions or concerns that should arise related to todays visit.   No orders of the defined types were placed in this encounter.   Meds ordered this encounter  Medications   cephALEXin (KEFLEX) 500 MG  capsule    Sig: Take 1 capsule (500 mg total) by mouth 2 (two) times daily.    Dispense:  20 capsule    Refill:  0    Time spent:15 Minutes Time spent includes review of chart, medications, test results, and follow up plan with the patient.    Johnna Acosta AGNP-C Nurse Practitioner
